# Patient Record
Sex: Female | Born: 1980 | Race: White | Hispanic: No | Marital: Married | Smoking: Never smoker
Health system: Southern US, Community
[De-identification: ages and names within clinical notes are randomized; demographics above are authoritative.]

## PROBLEM LIST (undated history)

## (undated) DIAGNOSIS — K219 Gastro-esophageal reflux disease without esophagitis: Secondary | ICD-10-CM

## (undated) HISTORY — PX: TONSILLECTOMY: SUR1361

## (undated) HISTORY — PX: OTHER SURGICAL HISTORY: SHX169

## (undated) HISTORY — PX: WISDOM TOOTH EXTRACTION: SHX21

---

## 1998-09-27 ENCOUNTER — Emergency Department (HOSPITAL_COMMUNITY): Admission: EM | Admit: 1998-09-27 | Discharge: 1998-09-27 | Payer: Self-pay | Admitting: Emergency Medicine

## 2003-07-01 ENCOUNTER — Other Ambulatory Visit: Admission: RE | Admit: 2003-07-01 | Discharge: 2003-07-01 | Payer: Self-pay | Admitting: Obstetrics and Gynecology

## 2004-07-01 ENCOUNTER — Other Ambulatory Visit: Admission: RE | Admit: 2004-07-01 | Discharge: 2004-07-01 | Payer: Self-pay | Admitting: Obstetrics and Gynecology

## 2005-06-16 ENCOUNTER — Encounter: Payer: Self-pay | Admitting: Internal Medicine

## 2005-06-16 LAB — CONVERTED CEMR LAB

## 2005-07-13 ENCOUNTER — Other Ambulatory Visit: Admission: RE | Admit: 2005-07-13 | Discharge: 2005-07-13 | Payer: Self-pay | Admitting: Obstetrics and Gynecology

## 2005-12-18 ENCOUNTER — Ambulatory Visit: Payer: Self-pay | Admitting: Internal Medicine

## 2006-01-23 ENCOUNTER — Ambulatory Visit: Payer: Self-pay | Admitting: Internal Medicine

## 2006-01-26 ENCOUNTER — Ambulatory Visit: Payer: Self-pay | Admitting: Internal Medicine

## 2006-07-13 ENCOUNTER — Other Ambulatory Visit: Admission: RE | Admit: 2006-07-13 | Discharge: 2006-07-13 | Payer: Self-pay | Admitting: Obstetrics & Gynecology

## 2007-07-02 ENCOUNTER — Ambulatory Visit: Payer: Self-pay | Admitting: Internal Medicine

## 2007-07-07 ENCOUNTER — Encounter: Payer: Self-pay | Admitting: Internal Medicine

## 2007-07-07 DIAGNOSIS — J45909 Unspecified asthma, uncomplicated: Secondary | ICD-10-CM | POA: Insufficient documentation

## 2007-07-07 DIAGNOSIS — M4120 Other idiopathic scoliosis, site unspecified: Secondary | ICD-10-CM | POA: Insufficient documentation

## 2007-07-07 DIAGNOSIS — E785 Hyperlipidemia, unspecified: Secondary | ICD-10-CM

## 2007-07-07 DIAGNOSIS — J309 Allergic rhinitis, unspecified: Secondary | ICD-10-CM | POA: Insufficient documentation

## 2007-07-09 ENCOUNTER — Ambulatory Visit: Payer: Self-pay | Admitting: Internal Medicine

## 2007-07-15 ENCOUNTER — Other Ambulatory Visit: Admission: RE | Admit: 2007-07-15 | Discharge: 2007-07-15 | Payer: Self-pay | Admitting: Obstetrics and Gynecology

## 2010-01-12 ENCOUNTER — Inpatient Hospital Stay (HOSPITAL_COMMUNITY): Admission: AD | Admit: 2010-01-12 | Discharge: 2010-01-15 | Payer: Self-pay | Admitting: Obstetrics and Gynecology

## 2010-01-17 ENCOUNTER — Encounter: Admission: RE | Admit: 2010-01-17 | Discharge: 2010-02-16 | Payer: Self-pay | Admitting: Obstetrics and Gynecology

## 2010-01-19 ENCOUNTER — Ambulatory Visit: Admission: RE | Admit: 2010-01-19 | Discharge: 2010-01-19 | Payer: Self-pay | Admitting: Obstetrics and Gynecology

## 2010-02-17 ENCOUNTER — Encounter: Admission: RE | Admit: 2010-02-17 | Discharge: 2010-03-17 | Payer: Self-pay | Admitting: Obstetrics and Gynecology

## 2011-01-04 LAB — CBC
HCT: 33.6 % — ABNORMAL LOW (ref 36.0–46.0)
Platelets: 109 10*3/uL — ABNORMAL LOW (ref 150–400)
RDW: 15.9 % — ABNORMAL HIGH (ref 11.5–15.5)
WBC: 18.4 10*3/uL — ABNORMAL HIGH (ref 4.0–10.5)

## 2011-01-04 LAB — CCBB MATERNAL DONOR DRAW

## 2011-01-09 LAB — CBC
Hemoglobin: 13.2 g/dL (ref 12.0–15.0)
RDW: 15.7 % — ABNORMAL HIGH (ref 11.5–15.5)
WBC: 11.3 10*3/uL — ABNORMAL HIGH (ref 4.0–10.5)

## 2011-01-09 LAB — COMPREHENSIVE METABOLIC PANEL
ALT: UNDETERMINED U/L (ref 0–35)
AST: UNDETERMINED U/L (ref 0–37)
Alkaline Phosphatase: 223 U/L — ABNORMAL HIGH (ref 39–117)
Calcium: 9 mg/dL (ref 8.4–10.5)
GFR calc Af Amer: >60 mL/min (ref >60)
Potassium: 4.8 mEq/L (ref 3.5–5.1)
Sodium: 137 mEq/L (ref 135–145)
Total Protein: 5.4 g/dL — ABNORMAL LOW (ref 6.0–8.3)

## 2011-01-09 LAB — LACTATE DEHYDROGENASE: LDH: 144 U/L (ref 94–250)

## 2011-01-09 LAB — AST: AST: 21 U/L (ref 0–37)

## 2011-01-11 LAB — OB RESULTS CONSOLE ABO/RH

## 2011-01-11 LAB — OB RESULTS CONSOLE ANTIBODY SCREEN: Antibody Screen: NEGATIVE

## 2011-01-11 LAB — OB RESULTS CONSOLE GBS: GBS: NEGATIVE

## 2011-01-11 LAB — OB RESULTS CONSOLE GC/CHLAMYDIA: Gonorrhea: NEGATIVE

## 2011-01-11 LAB — OB RESULTS CONSOLE RUBELLA ANTIBODY, IGM: Rubella: IMMUNE

## 2011-10-17 NOTE — L&D Delivery Note (Signed)
Patient was C/C+1 and pushed for approx 90 minutes with epidural.   NSVD female infant, Apgars 9/9, weight pending.   The patient had a second degree perineal lac repaired with 2-0 vicryl. Fundus was firm. EBL was expected. Placenta was delivered intact, handed off for cord blood collection. Vagina was clear.  Baby was vigorous to bedside.  Erin Barajas

## 2012-01-11 LAB — OB RESULTS CONSOLE GC/CHLAMYDIA
Chlamydia: NEGATIVE
Gonorrhea: NEGATIVE

## 2012-01-11 LAB — OB RESULTS CONSOLE HIV ANTIBODY (ROUTINE TESTING): HIV: NONREACTIVE

## 2012-07-30 ENCOUNTER — Encounter (HOSPITAL_COMMUNITY): Payer: Self-pay | Admitting: Anesthesiology

## 2012-07-30 ENCOUNTER — Encounter (HOSPITAL_COMMUNITY): Payer: Self-pay | Admitting: *Deleted

## 2012-07-30 ENCOUNTER — Inpatient Hospital Stay (HOSPITAL_COMMUNITY): Payer: Managed Care, Other (non HMO) | Admitting: Anesthesiology

## 2012-07-30 ENCOUNTER — Inpatient Hospital Stay (HOSPITAL_COMMUNITY)
Admission: AD | Admit: 2012-07-30 | Discharge: 2012-08-01 | DRG: 775 | Disposition: A | Discharge status: Home / Self Care | Payer: Managed Care, Other (non HMO) | Source: Ambulatory Visit | Attending: Obstetrics & Gynecology | Admitting: Obstetrics & Gynecology

## 2012-07-30 DIAGNOSIS — O4290 Premature rupture of membranes, unspecified as to length of time between rupture and onset of labor, unspecified weeks of gestation: Secondary | ICD-10-CM | POA: Diagnosis present

## 2012-07-30 DIAGNOSIS — Z3480 Encounter for supervision of other normal pregnancy, unspecified trimester: Secondary | ICD-10-CM

## 2012-07-30 HISTORY — DX: Gastro-esophageal reflux disease without esophagitis: K21.9

## 2012-07-30 LAB — CBC
Platelets: 160 10*3/uL (ref 150–400)
RBC: 4.24 MIL/uL (ref 3.87–5.11)
RDW: 15 % (ref 11.5–15.5)
WBC: 7.6 10*3/uL (ref 4.0–10.5)

## 2012-07-30 LAB — ABO/RH: ABO/RH(D): O POS

## 2012-07-30 LAB — POCT FERN TEST: Fern Test: POSITIVE

## 2012-07-30 LAB — RPR: RPR Ser Ql: NONREACTIVE

## 2012-07-30 MED ORDER — ONDANSETRON HCL 4 MG/2ML IJ SOLN: 4.0000 mg | Freq: Four times a day (QID) | INTRAMUSCULAR | Status: DC | PRN

## 2012-07-30 MED ORDER — SIMETHICONE 80 MG PO CHEW: 80.0000 mg | CHEWABLE_TABLET | ORAL | Status: DC | PRN

## 2012-07-30 MED ORDER — IBUPROFEN 600 MG PO TABS: 600.0000 mg | ORAL_TABLET | Freq: Four times a day (QID) | ORAL | Status: DC | PRN

## 2012-07-30 MED ORDER — BENZOCAINE-MENTHOL 20-0.5 % EX AERO
1.0000 "application " | INHALATION_SPRAY | CUTANEOUS | Status: DC | PRN
  Administered 2012-07-30 – 2012-08-01 (×2): 1 via TOPICAL
  Filled 2012-07-30 (×2): qty 56

## 2012-07-30 MED ORDER — ONDANSETRON HCL 4 MG/2ML IJ SOLN: 4.0000 mg | INTRAMUSCULAR | Status: DC | PRN

## 2012-07-30 MED ORDER — OXYCODONE-ACETAMINOPHEN 5-325 MG PO TABS: 1.0000 | ORAL_TABLET | ORAL | Status: DC | PRN

## 2012-07-30 MED ORDER — LACTATED RINGERS IV SOLN
500.0000 mL | INTRAVENOUS | Status: DC | PRN
  Administered 2012-07-30: 1000 mL via INTRAVENOUS

## 2012-07-30 MED ORDER — CITRIC ACID-SODIUM CITRATE 334-500 MG/5ML PO SOLN: 30.0000 mL | ORAL | Status: DC | PRN

## 2012-07-30 MED ORDER — OXYTOCIN BOLUS FROM INFUSION
500.0000 mL | Freq: Once | INTRAVENOUS | Status: AC
  Administered 2012-07-30: 500 mL via INTRAVENOUS
  Filled 2012-07-30: qty 500

## 2012-07-30 MED ORDER — IBUPROFEN 600 MG PO TABS
600.0000 mg | ORAL_TABLET | Freq: Four times a day (QID) | ORAL | Status: DC
  Administered 2012-07-30 – 2012-08-01 (×7): 600 mg via ORAL
  Filled 2012-07-30 (×6): qty 1

## 2012-07-30 MED ORDER — EPHEDRINE 5 MG/ML INJ
10.0000 mg | INTRAVENOUS | Status: DC | PRN
  Filled 2012-07-30: qty 4

## 2012-07-30 MED ORDER — ONDANSETRON HCL 4 MG PO TABS: 4.0000 mg | ORAL_TABLET | ORAL | Status: DC | PRN

## 2012-07-30 MED ORDER — DIPHENHYDRAMINE HCL 25 MG PO CAPS: 25.0000 mg | ORAL_CAPSULE | Freq: Four times a day (QID) | ORAL | Status: DC | PRN

## 2012-07-30 MED ORDER — ACETAMINOPHEN 325 MG PO TABS: 650.0000 mg | ORAL_TABLET | ORAL | Status: DC | PRN

## 2012-07-30 MED ORDER — EPHEDRINE 5 MG/ML INJ: 10.0000 mg | INTRAVENOUS | Status: DC | PRN

## 2012-07-30 MED ORDER — FAMOTIDINE 20 MG PO TABS
20.0000 mg | ORAL_TABLET | Freq: Two times a day (BID) | ORAL | Status: DC | PRN
  Administered 2012-07-30: 20 mg via ORAL
  Filled 2012-07-30: qty 1

## 2012-07-30 MED ORDER — LACTATED RINGERS IV SOLN
INTRAVENOUS | Status: DC
  Administered 2012-07-30 (×2): via INTRAVENOUS

## 2012-07-30 MED ORDER — PRENATAL MULTIVITAMIN CH: 1.0000 | ORAL_TABLET | Freq: Every day | ORAL | Status: DC

## 2012-07-30 MED ORDER — WITCH HAZEL-GLYCERIN EX PADS: 1.0000 "application " | MEDICATED_PAD | CUTANEOUS | Status: DC | PRN

## 2012-07-30 MED ORDER — PHENYLEPHRINE 40 MCG/ML (10ML) SYRINGE FOR IV PUSH (FOR BLOOD PRESSURE SUPPORT)
80.0000 ug | PREFILLED_SYRINGE | INTRAVENOUS | Status: DC | PRN
  Filled 2012-07-30: qty 5

## 2012-07-30 MED ORDER — SENNOSIDES-DOCUSATE SODIUM 8.6-50 MG PO TABS
2.0000 | ORAL_TABLET | Freq: Every day | ORAL | Status: DC
  Administered 2012-07-30 – 2012-07-31 (×2): 2 via ORAL

## 2012-07-30 MED ORDER — INFLUENZA VIRUS VACC SPLIT PF IM SUSP
0.5000 mL | INTRAMUSCULAR | Status: AC
  Administered 2012-07-31: 0.5 mL via INTRAMUSCULAR
  Filled 2012-07-30: qty 0.5

## 2012-07-30 MED ORDER — LANOLIN HYDROUS EX OINT: TOPICAL_OINTMENT | CUTANEOUS | Status: DC | PRN

## 2012-07-30 MED ORDER — FENTANYL 2.5 MCG/ML BUPIVACAINE 1/10 % EPIDURAL INFUSION (WH - ANES)
14.0000 mL/h | INTRAMUSCULAR | Status: DC
  Administered 2012-07-30 (×2): 14 mL/h via EPIDURAL
  Filled 2012-07-30 (×2): qty 125

## 2012-07-30 MED ORDER — ZOLPIDEM TARTRATE 5 MG PO TABS: 5.0000 mg | ORAL_TABLET | Freq: Every evening | ORAL | Status: DC | PRN

## 2012-07-30 MED ORDER — LIDOCAINE HCL (PF) 1 % IJ SOLN
30.0000 mL | INTRAMUSCULAR | Status: DC | PRN
  Filled 2012-07-30: qty 30

## 2012-07-30 MED ORDER — PRENATAL MULTIVITAMIN CH
1.0000 | ORAL_TABLET | Freq: Every day | ORAL | Status: DC
  Administered 2012-07-30 – 2012-08-01 (×3): 1 via ORAL
  Filled 2012-07-30 (×3): qty 1

## 2012-07-30 MED ORDER — LACTATED RINGERS IV SOLN: 500.0000 mL | Freq: Once | INTRAVENOUS | Status: DC

## 2012-07-30 MED ORDER — BUTORPHANOL TARTRATE 1 MG/ML IJ SOLN: 1.0000 mg | INTRAMUSCULAR | Status: DC | PRN

## 2012-07-30 MED ORDER — DIPHENHYDRAMINE HCL 50 MG/ML IJ SOLN: 12.5000 mg | INTRAMUSCULAR | Status: DC | PRN

## 2012-07-30 MED ORDER — DIBUCAINE 1 % RE OINT: 1.0000 "application " | TOPICAL_OINTMENT | RECTAL | Status: DC | PRN

## 2012-07-30 MED ORDER — TETANUS-DIPHTH-ACELL PERTUSSIS 5-2.5-18.5 LF-MCG/0.5 IM SUSP: 0.5000 mL | Freq: Once | INTRAMUSCULAR | Status: DC

## 2012-07-30 MED ORDER — PHENYLEPHRINE 40 MCG/ML (10ML) SYRINGE FOR IV PUSH (FOR BLOOD PRESSURE SUPPORT): 80.0000 ug | PREFILLED_SYRINGE | INTRAVENOUS | Status: DC | PRN

## 2012-07-30 MED ORDER — LIDOCAINE HCL (PF) 1 % IJ SOLN
INTRAMUSCULAR | Status: DC | PRN
  Administered 2012-07-30 (×4): 4 mL

## 2012-07-30 MED ORDER — OXYTOCIN 40 UNITS IN LACTATED RINGERS INFUSION - SIMPLE MED
62.5000 mL/h | Freq: Once | INTRAVENOUS | Status: DC
  Filled 2012-07-30: qty 1000

## 2012-07-30 NOTE — H&P (Signed)
  31 y.o. G2P1001  Estimated Date of Delivery: 07/31/12 admitted at 39/[redacted] weeks gestation PROM.  Prenatal Transfer Tool  Maternal Diabetes: No Genetic Screening: Normal Maternal Ultrasounds/Referrals: Normal Fetal Ultrasounds or other Referrals:  None Maternal Substance Abuse:  No Significant Maternal Medications:  None Significant Maternal Lab Results: None Other Significant Pregnancy Complications: None   Afebrile, VSS Heart and Lungs: No active disease Abdomen: soft, gravid, EFW AGA. Cervical exam:  3/50, grossly ruptured.  Impression: PROM  Plan:  Observe for labor, induce if necessary

## 2012-07-30 NOTE — Anesthesia Procedure Notes (Signed)
Epidural Patient location during procedure: OB Start time: 07/30/2012 4:46 AM  Staffing Performed by: anesthesiologist   Preanesthetic Checklist Completed: patient identified, site marked, surgical consent, pre-op evaluation, timeout performed, IV checked, risks and benefits discussed and monitors and equipment checked  Epidural Patient position: sitting Prep: site prepped and draped and DuraPrep Patient monitoring: continuous pulse ox and blood pressure Approach: midline Injection technique: LOR air  Needle:  Needle type: Tuohy  Needle gauge: 17 G Needle length: 9 cm and 9 Needle insertion depth: 7 cm Catheter type: closed end flexible Catheter size: 19 Gauge Catheter at skin depth: 12 cm Test dose: negative  Assessment Events: blood not aspirated, injection not painful, no injection resistance, negative IV test and no paresthesia  Additional Notes Discussed risk of headache, infection, bleeding, nerve injury and failed or incomplete block.  Patient voices understanding and wishes to proceed. Reason for block:procedure for pain

## 2012-07-30 NOTE — Anesthesia Postprocedure Evaluation (Signed)
  Anesthesia Post-op Note  Patient: Erin Barajas  Procedure(s) Performed: * No procedures listed *  Patient Location: PACU and Mother/Baby  Anesthesia Type: Epidural  Level of Consciousness: awake, alert  and oriented  Airway and Oxygen Therapy: Patient Spontanous Breathing  Post-op Pain: mild  Post-op Assessment: Patient's Cardiovascular Status Stable, Respiratory Function Stable, No signs of Nausea or vomiting and Pain level controlled  Post-op Vital Signs: stable  Complications: No apparent anesthesia complications

## 2012-07-30 NOTE — MAU Note (Signed)
Pt reports ROM @ 0040, greenish fluid. Contractions started after ROM

## 2012-07-30 NOTE — Anesthesia Preprocedure Evaluation (Signed)
Anesthesia Evaluation  Patient identified by MRN, date of birth, ID band Patient awake    Reviewed: Allergy & Precautions, H&P , NPO status , Patient's Chart, lab work & pertinent test results, reviewed documented beta blocker date and time   History of Anesthesia Complications Negative for: history of anesthetic complications  Airway Mallampati: II TM Distance: >3 FB Neck ROM: full    Dental  (+) Teeth Intact   Pulmonary neg pulmonary ROS,  breath sounds clear to auscultation        Cardiovascular negative cardio ROS  Rhythm:regular Rate:Normal     Neuro/Psych negative neurological ROS  negative psych ROS   GI/Hepatic Neg liver ROS, GERD-  Medicated,  Endo/Other  negative endocrine ROS  Renal/GU negative Renal ROS     Musculoskeletal   Abdominal   Peds  Hematology negative hematology ROS (+)   Anesthesia Other Findings   Reproductive/Obstetrics (+) Pregnancy                           Anesthesia Physical Anesthesia Plan  ASA: II  Anesthesia Plan: Epidural   Post-op Pain Management:    Induction:   Airway Management Planned:   Additional Equipment:   Intra-op Plan:   Post-operative Plan:   Informed Consent: I have reviewed the patients History and Physical, chart, labs and discussed the procedure including the risks, benefits and alternatives for the proposed anesthesia with the patient or authorized representative who has indicated his/her understanding and acceptance.     Plan Discussed with:   Anesthesia Plan Comments:         Anesthesia Quick Evaluation  

## 2012-07-30 NOTE — Progress Notes (Signed)
Pt comfortable, s/p epidural. FHT reassuring 140 +accels TOCO q2-4 SVE: per RN 6.6/70/-2 Continue current mgmt

## 2012-07-31 LAB — CBC
HCT: 36 % (ref 36.0–46.0)
Hemoglobin: 11.6 g/dL — ABNORMAL LOW (ref 12.0–15.0)
MCHC: 32.2 g/dL (ref 30.0–36.0)
MCV: 87.4 fL (ref 78.0–100.0)
RDW: 15.1 % (ref 11.5–15.5)

## 2012-07-31 MED ORDER — SUCROSE 24% NICU/PEDS ORAL SOLUTION
0.5000 mL | OROMUCOSAL | Status: AC
  Filled 2012-07-31 (×2): qty 0.5

## 2012-07-31 MED ORDER — LIDOCAINE 1%/NA BICARB 0.1 MEQ INJECTION
0.8000 mL | INJECTION | Freq: Once | INTRAVENOUS | Status: DC
  Filled 2012-07-31: qty 1

## 2012-07-31 MED ORDER — ACETAMINOPHEN FOR CIRCUMCISION 160 MG/5 ML
40.0000 mg | Freq: Once | ORAL | Status: DC
  Filled 2012-07-31: qty 2.5

## 2012-07-31 MED ORDER — EPINEPHRINE TOPICAL FOR CIRCUMCISION 0.1 MG/ML
1.0000 [drp] | TOPICAL | Status: DC | PRN
  Filled 2012-07-31: qty 0.05

## 2012-07-31 MED ORDER — ACETAMINOPHEN FOR CIRCUMCISION 160 MG/5 ML
40.0000 mg | ORAL | Status: DC | PRN
  Filled 2012-07-31: qty 2.5

## 2012-07-31 NOTE — Progress Notes (Signed)
Post Partum Day 1 Subjective: no complaints  Objective: Blood pressure 119/74, pulse 82, temperature 97.4 F (36.3 C), temperature source Oral, resp. rate 18, height 5\' 8"  (1.727 m), weight 197 lb (89.359 kg), breastfeeding.  Physical Exam:  General: alert Lochia: appropriate Uterine Fundus: firm   Basename 07/31/12 0530 07/30/12 0240  HGB 11.6* 12.0  HCT 36.0 36.8    Assessment/Plan: Plan for discharge tomorrow   LOS: 1 day   Tarrah Furuta D 07/31/2012, 8:56 AM

## 2012-08-01 ENCOUNTER — Encounter (HOSPITAL_COMMUNITY)
Admission: RE | Admit: 2012-08-01 | Discharge: 2012-08-01 | Disposition: A | Discharge status: Home / Self Care | Payer: Managed Care, Other (non HMO) | Source: Ambulatory Visit | Attending: Obstetrics and Gynecology | Admitting: Obstetrics and Gynecology

## 2012-08-01 DIAGNOSIS — O923 Agalactia: Secondary | ICD-10-CM | POA: Insufficient documentation

## 2012-08-01 MED ORDER — OXYCODONE-ACETAMINOPHEN 5-325 MG PO TABS: 1.0000 | ORAL_TABLET | ORAL | Status: AC | PRN

## 2012-08-01 NOTE — Discharge Summary (Signed)
Obstetric Discharge Summary Reason for Admission: rupture of membranes Prenatal Procedures: ultrasound Intrapartum Procedures: spontaneous vaginal delivery Postpartum Procedures: none Complications-Operative and Postpartum: 2 degree perineal laceration Hemoglobin  Date Value Range Status  07/31/2012 11.6* 12.0 - 15.0 g/dL Final     HCT  Date Value Range Status  07/31/2012 36.0  36.0 - 46.0 % Final    Physical Exam:  General: alert Lochia: appropriate Uterine Fundus: firm   Discharge Diagnoses: Term Pregnancy-delivered  Discharge Information: Date: 08/01/2012 Activity: pelvic rest Diet: routine Medications: PNV, Ibuprofen and Percocet Condition: stable Instructions: refer to practice specific booklet Discharge to: home   Newborn Data: Live born female  Birth Weight: 8 lb 11.2 oz (3945 g) APGAR: 8, 9  Home with mother.  Barby Colvard E 08/01/2012, 8:11 AM

## 2012-08-01 NOTE — Progress Notes (Signed)
Pt discharged before Sw could assess history of PP depression.

## 2012-08-01 NOTE — Progress Notes (Signed)
PPD#2 Pt without complaints. VSSAF Lochia-wnl IMP/ stable Plan/ discharge.

## 2012-09-01 ENCOUNTER — Encounter (HOSPITAL_COMMUNITY)
Admission: RE | Admit: 2012-09-01 | Discharge: 2012-09-01 | Disposition: A | Discharge status: Home / Self Care | Payer: Managed Care, Other (non HMO) | Source: Ambulatory Visit | Attending: Obstetrics and Gynecology | Admitting: Obstetrics and Gynecology

## 2012-09-01 DIAGNOSIS — O923 Agalactia: Secondary | ICD-10-CM | POA: Insufficient documentation

## 2014-08-17 ENCOUNTER — Encounter (HOSPITAL_COMMUNITY): Payer: Self-pay | Admitting: *Deleted

## 2018-08-21 ENCOUNTER — Other Ambulatory Visit: Payer: Self-pay | Admitting: Obstetrics and Gynecology

## 2018-08-21 DIAGNOSIS — Z9189 Other specified personal risk factors, not elsewhere classified: Secondary | ICD-10-CM

## 2018-09-03 ENCOUNTER — Ambulatory Visit
Admission: RE | Admit: 2018-09-03 | Discharge: 2018-09-03 | Disposition: A | Discharge status: Home / Self Care | Payer: BLUE CROSS/BLUE SHIELD | Source: Ambulatory Visit | Attending: Obstetrics and Gynecology | Admitting: Obstetrics and Gynecology

## 2018-09-03 DIAGNOSIS — Z9189 Other specified personal risk factors, not elsewhere classified: Secondary | ICD-10-CM

## 2018-09-03 MED ORDER — GADOBUTROL 1 MMOL/ML IV SOLN
7.0000 mL | Freq: Once | INTRAVENOUS | Status: AC | PRN
  Administered 2018-09-03: 7 mL via INTRAVENOUS

## 2018-09-09 ENCOUNTER — Other Ambulatory Visit: Payer: Self-pay | Admitting: Obstetrics and Gynecology

## 2018-09-09 DIAGNOSIS — N63 Unspecified lump in unspecified breast: Secondary | ICD-10-CM

## 2018-09-20 ENCOUNTER — Ambulatory Visit
Admission: RE | Admit: 2018-09-20 | Discharge: 2018-09-20 | Disposition: A | Discharge status: Home / Self Care | Payer: BLUE CROSS/BLUE SHIELD | Source: Ambulatory Visit | Attending: Obstetrics and Gynecology | Admitting: Obstetrics and Gynecology

## 2018-09-20 DIAGNOSIS — N63 Unspecified lump in unspecified breast: Secondary | ICD-10-CM

## 2018-09-20 MED ORDER — GADOBUTROL 1 MMOL/ML IV SOLN
7.0000 mL | Freq: Once | INTRAVENOUS | Status: AC | PRN
  Administered 2018-09-20: 7 mL via INTRAVENOUS

## 2019-12-16 IMAGING — MG MM CLIP PLACEMENT
2 series · 2 of 2 positions shown · non-contrast
Comparison: Previous exam(s).

CLINICAL DATA: Status post MR guided core biopsy of non mass
enhancement in the UPPER INNER QUADRANT of the RIGHT breast.

EXAM:
DIAGNOSTIC RIGHT MAMMOGRAM POST MRI BIOPSY

[R CC]
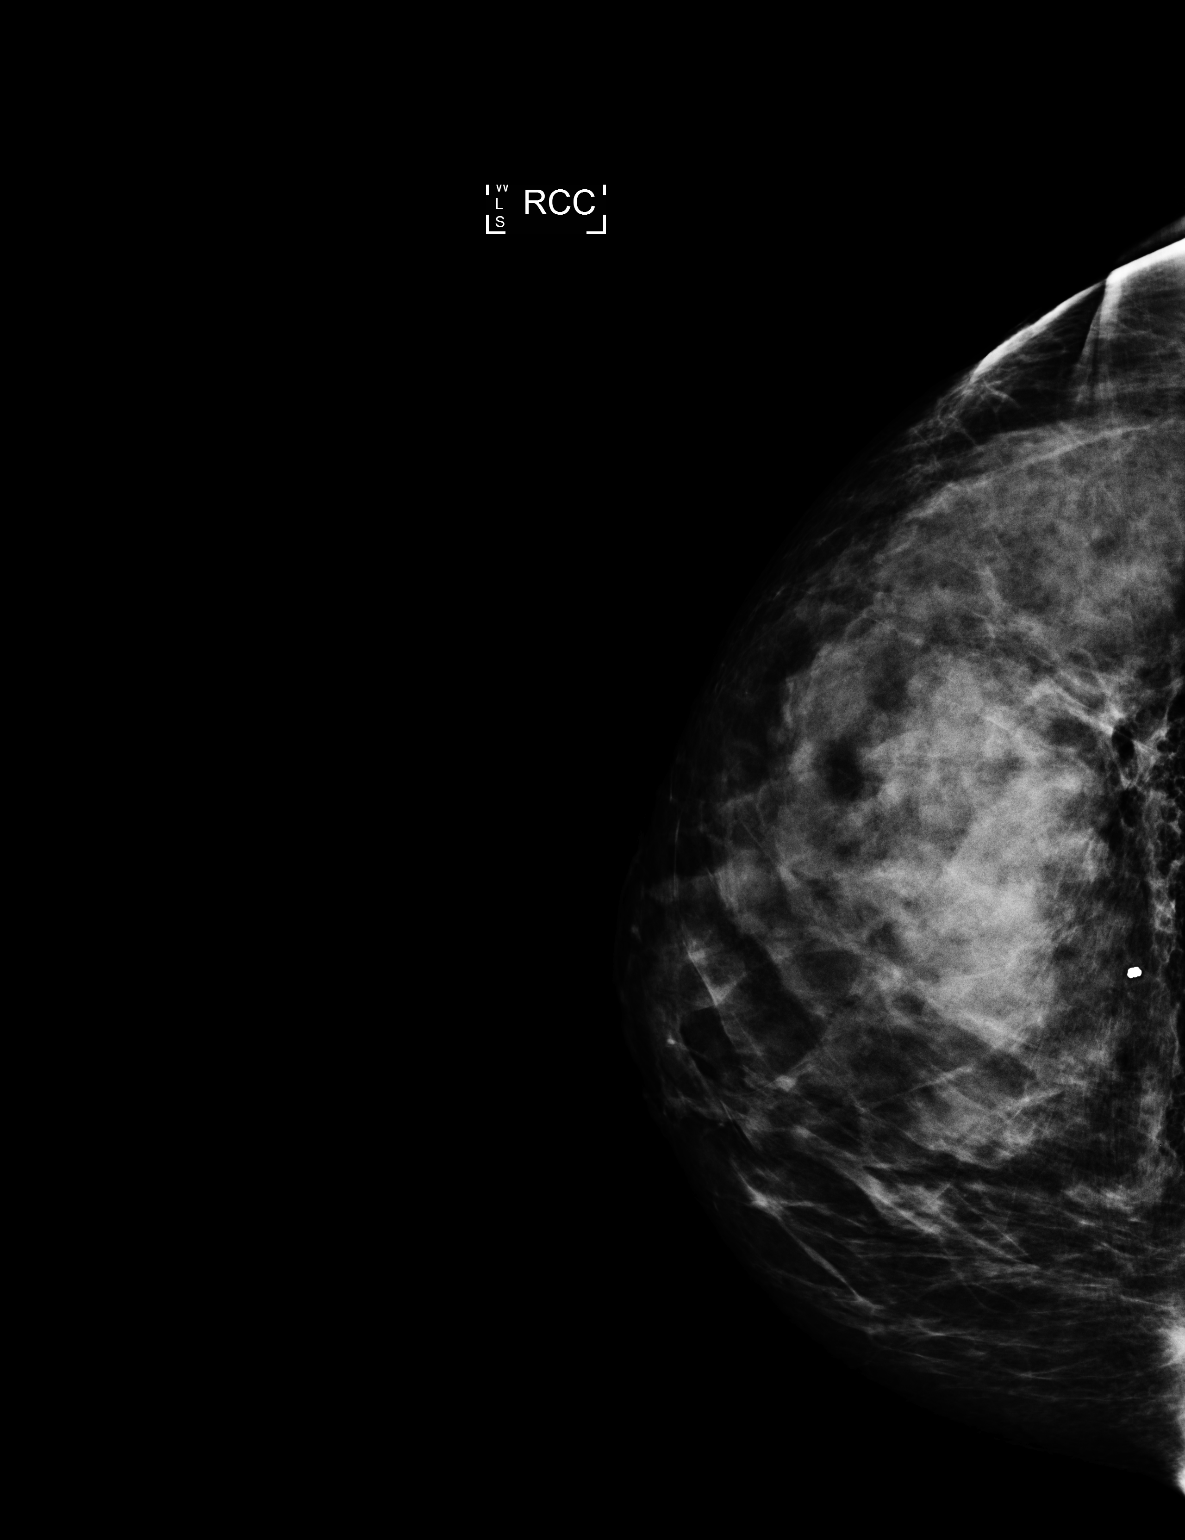

[R ML]
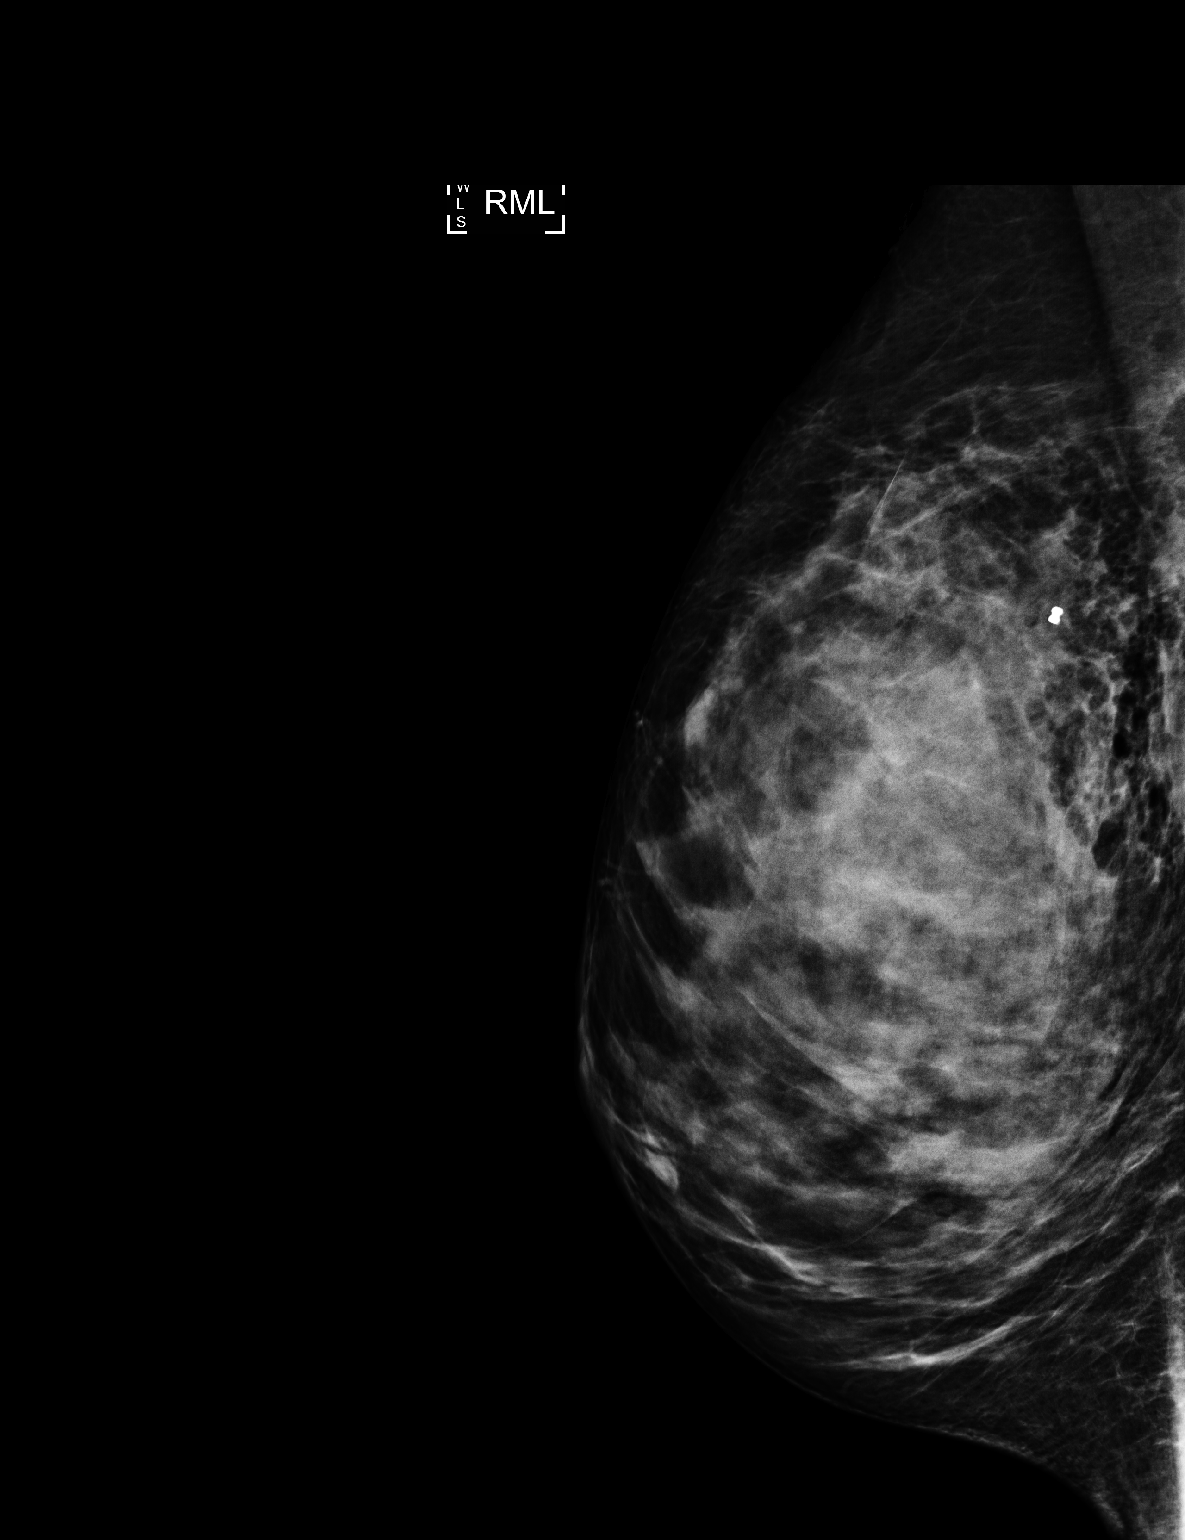

[2 of 2 positions shown; findings below may reference images not displayed]

FINDINGS: Mammographic images were obtained following MRI guided biopsy of non
mass enhancement in the UPPER INNER QUADRANT of the RIGHT breast and
placement of a dumbbell-shaped clip. The clip is in expected
location in the UPPER INNER QUADRANT following biopsy.
IMPRESSION: Tissue marker clip in the expected location following biopsy.

Final Assessment: Post Procedure Mammograms for Marker Placement

## 2020-01-12 NOTE — ED Notes (Signed)
ED Triage Note       ED Triage Adult Entered On:  01/12/2020 21:30 EDT    Performed On:  01/12/2020 21:25 EDT by Lorin Picket, RN, Rudean Haskell               Triage   Chief Complaint :   Missed a step and foot went out under her hyperflexing her foot.  She felt a pop.   Numeric Rating Pain Scale :   4   [place] Mode of Arrival :   Private vehicle   Infectious Disease Documentation :   Document assessment   Temperature Oral :   37 degC(Converted to: 98.6 degF)    Heart Rate Monitored :   87 bpm   Respiratory Rate :   17 br/min   Systolic Blood Pressure :   132 mmHg   Diastolic Blood Pressure :   77 mmHg   SpO2 :   99 %   Oxygen Therapy :   Room air   Patient presentation :   None of the above   Chief Complaint or Presentation suggest infection :   No   Dosing Weight Obtained By :   Patient stated   Weight Dosing :   70 kg(Converted to: 154 lb 5 oz)    Height :   160 cm(Converted to: 5 ft 3 in)    Body Mass Index Dosing :   27 kg/m2   Juline Patch - 01/12/2020 21:25 EDT   DCP GENERIC CODE   Tracking Acuity :   3   Tracking Group :   ED [facility] Tracking [facility]   Burien, Loanne Drilling 01/12/2020 21:25 EDT   ED General Section :   Document assessment   Pregnancy Status :   Patient denies   ED Allergies Section :   Document assessment   ED Reason for Visit Section :   Document assessment   Juline Patch - 01/12/2020 21:25 EDT   ID Risk Screen Symptoms   Recent Travel History :   No recent travel   Close Contact with COVID-19 ID :   No   Last 14 days COVID-19 ID :   No   TB Symptom Screen :   No symptoms   Juline Patch - 01/12/2020 21:25 EDT   Allergies   (As Of: 01/12/2020 21:30:26 EDT)   Allergies (Active)   penicillin  Estimated Onset Date:   Unspecified ; Reactions:   Rash ; Created By:   Lorin Picket, RN, Rudean Haskell; Reaction Status:   Active ; Category:   Drug ; Substance:   penicillin ; Type:   Allergy ; Severity:   Moderate ; Updated By:   Lorin Picket, RN, Rudean Haskell; Reviewed Date:   01/12/2020 21:29 EDT         Psycho-Social   Last 3 mo, thoughts killing self/others :   Patient denies   Feels Unsafe at Home :   No   Lorin Picket, RNRudean Haskell 01/12/2020 21:25 EDT   ED Reason for Visit   (As Of: 01/12/2020 21:30:26 EDT)   Diagnoses(Active)    Foot pain-swelling  Date:   01/12/2020 ; Diagnosis Type:   Reason For Visit ; Confirmation:   Complaint of ; Clinical Dx:   Foot pain-swelling ; Classification:   Medical ; Clinical Service:   Emergency medicine ; Code:   PNED ; Probability:   0 ; Diagnosis Code:   [id number] BEB

## 2020-01-12 NOTE — ED Provider Notes (Signed)
Ankle-Foot-Toe Pain *ED        Patient:   Michelle Barrera, Michelle Barrera             MRN: [id number]            FIN: [id number]               Age:   39 years     Sex:  Female     DOB:  27-Jun-1981   Associated Diagnoses:   Sprain of left foot   Author:   Gordy Councilman S-MD      Basic Information   Time seen: Provider Seen (ST)   ED Provider/Time:    Gordy Councilman S-MD / 01/12/2020 21:21    .   Additional information: Chief Complaint from Nursing Triage Note   Chief Complaint  Chief Complaint: Missed a step and foot went out under her hyperflexing her foot.  She felt a pop. (01/12/20 21:25:00)  .     39 year old female presents for concerns of left foot pain and tenderness along the anterior surface.  It occurred after hyperflexing her foot few hours prior to arrival.  Patient states that anytime she walks she would feel something pop.  No focal weakness, numbness, tingling.  Soft tissue swelling has increased.  She has been placing some ice with intermittent improvement.  No other symptoms endorsed.      Review of Systems   Constitutional symptoms:  Negative except as documented in HPI.   Musculoskeletal symptoms:  Muscle pain, Joint pain.    Neurologic symptoms:  Negative except as documented in HPI.             Additional review of systems information: All other systems reviewed and otherwise negative.      Health Status   Allergies:    Allergic Reactions (All)  Moderate  Penicillin- Rash.  .      Past Medical/ Family/ Social History   Medical history: Reviewed as documented in chart.   Surgical history: Reviewed as documented in chart.   Family history: Not significant.   Social history: Reviewed as documented in chart.   Problem list:    No qualifying data available    .      Physical Examination               Vital Signs             Time:  01/12/2020 22:04:00.   Vital Signs   01/12/2020 21:25 EDT Systolic Blood Pressure 132 mmHg    Diastolic Blood Pressure 77 mmHg    Temperature Oral 37 degC    Heart Rate Monitored 87 bpm     Respiratory Rate 17 br/min    SpO2 99 %   .   Oxygen saturation.   General:  Alert, no acute distress.    Skin:  Warm, dry, pink.    Head:  Normocephalic.   Neck:  Supple, trachea midline.    Eye:  Pupils are equal, round and reactive to light, extraocular movements are intact.    Cardiovascular:  Regular rate and rhythm, Normal peripheral perfusion.    Respiratory:  Lungs are clear to auscultation, respirations are non-labored.    Musculoskeletal:  Left foot exam remarkable for soft tissue swelling anteriorly, diffuse and nonfocal tenderness to palpation.  Neurovascularly intact..   Chest wall   Neurological:  Alert and oriented to person, place, time, and situation, No focal neurological deficit observed.    Lymphatics   Psychiatric:  Cooperative, appropriate mood & affect.       Medical Decision Making   Rationale:  39 year old female presents for concerns of left foot pain and tenderness after a straining mechanism.  I see no acute fracture on my review.  Given soft tissue swelling, we will put her in a postop shoe and have her follow-up with orthopedics.  After the patient had left the emergency department, the radiologist read this as a single view only potential fracture along the either the second third and fourth, she is already in a postop shoe and will follow up with orthopedics as previously planned. 39 year old female presents for concerns of left foot pain and tenderness after a straining mechanism.  I see no acute fracture on my review.  Given soft tissue swelling, we will put her in a postop shoe and have her follow-up with orthopedics. .   Documents reviewed:  Emergency department nurses' notes, flowsheet, emergency department records, prior records, vital signs.    Radiology results:  Rad Results (ST)   XR Foot Complete Left  ?  01/12/20 22:19:43  Left foot AP, lateral, and oblique: 01/12/20    INDICATION:Pain, Joint, Ankle/Foot.    COMPARISON: None    FINDINGS: Seen only on lateral view mildly  superiorly displaced fracture  fragment just superior to the tarsometatarsal joints, favored to be from the  base of one of the metatarsals (second through fourth), or less likely from the  distal tarsal row. There is overlying soft tissue swelling. No other fracture,  no dislocation, or periosteal reaction. No degenerative change. No focal lytic  or sclerotic lesion. No other evident soft tissue abnormality.    IMPRESSION:  Fracture detailed above. Consider orthopedic referral. Could be further  evaluated by CT if needed.  ?  Signed By: Violet Baldy C-MD    .      Impression and Plan   Diagnosis   Sprain of left foot (ICD10-CM S93.602A, Discharge, Medical)   Plan   Condition: Improved.    Disposition: Medically cleared, Discharged: Time  01/12/2020 22:05:00, to home.    Prescriptions: [facility] prescriptions   Pharmacy:  Norco 325 mg-5 mg oral tablet (Prescribe): 1 tabs, Oral, q6hr, for 3 days, PRN: moderate pain (4-7), 12 tabs, 0 Refill(s)  .    Patient was given the following educational materials: Foot Sprain.    Follow up with: Follow up with primary care provider Within 1 week; Dola Factor, Physician - Orthopedics Within 1 week.    Counseled: I had a detailed discussion with the patient and/or guardian regarding the historical points/exam findings supporting the discharge diagnosis and need for outpatient followup. Discussed the need to return to the ER if symptoms persist/worsen, or for any questions/concerns that arise at home.    Signature Line     Electronically Signed on 01/12/2020 10:07 PM EDT   ________________________________________________   Milon Dikes S-MD      Electronically Signed on 01/12/2020 10:39 PM EDT   ________________________________________________   Milon Dikes S-MD            Modified by: Milon Dikes S-MD on 01/12/2020 10:07 PM EDT      Modified by: Milon Dikes S-MD on 01/12/2020 10:39 PM EDT

## 2020-01-12 NOTE — ED Notes (Signed)
ED Patient Education Note     Patient Education Materials Follows:  Musculoskeletal     Foot Sprain    A foot sprain is an injury to one of the strong bands of tissue (ligaments) that connect and support the many bones in your feet. The ligament can be stretched too much or it can tear. A tear can be either partial or complete. The severity of the sprain depends on how much of the ligament was damaged or torn.    CAUSES    A foot sprain is usually caused by suddenly twisting or pivoting your foot.    RISK FACTORS    This injury is more likely to occur in people who:     Play a sport, such as basketball or football.     Exercise or play a sport without warming up.     Start a new workout or sport.     Suddenly increase how long or hard they exercise or play a sport.    SYMPTOMS    Symptoms of this condition start soon after an injury and include:     Pain, especially in the arch of the foot.     Bruising.     Swelling.     Inability to walk or use the foot to support body weight.    DIAGNOSIS    This condition is diagnosed with a medical history and physical exam. You may also have imaging tests, such as:     X-rays to make sure there are no broken bones (fractures).     MRI to see if the ligament has torn.    TREATMENT    Treatment varies depending on the severity of your sprain. Mild sprains can be treated with rest, ice, compression, and elevation (RICE). If your ligament is overstretched or partially torn, treatment usually involves keeping your foot in a fixed position (immobilization) for a period of time. To help you do this, your health care provider will apply a bandage, splint, or walking boot to keep your foot from moving until it heals. You may also be advised to use crutches or a scooter for a few weeks to avoid bearing weight on your foot while it is healing.    If your ligament is fully torn, you may need surgery to reconnect the ligament to the bone. After surgery, a cast or splint will be applied and  will need to stay on your foot while it heals.    Your health care provider may also suggest exercises or physical therapy to strengthen your foot.    HOME CARE INSTRUCTIONS    If You Have a Bandage, Splint, or Walking Boot:     Wear it as directed by your health care provider. Remove it only as directed by your health care provider.     Loosen the bandage, splint, or walking boot if your toes become numb and tingle, or if they turn cold and blue.    Bathing     If your health care provider approves bathing and showering, cover the bandage or splint with a watertight plastic bag to protect it from water. Do not let the bandage or splint get wet.    Managing Pain, Stiffness, and Swelling      If directed, apply ice to the injured area:    ? Put ice in a plastic bag.    ? Place a towel between your skin and the bag.    ? Leave the ice on   for 20 minutes, 2?3 times per day.     Move your toes often to avoid stiffness and to lessen swelling.     Raise (elevate) the injured area above the level of your heart while you are sitting or lying down.    Driving     Do not drive or operate heavy machinery while taking pain medicine.     Ask your health care provider when it is safe to drive if you have a bandage, splint, or walking boot on your foot.    Activity     Rest as directed by your health care provider.     Do not use the injured foot to support your body weight until your health care provider says that you can. Use crutches or other supportive devices as directed by your health care provider.     Ask your health care provider what activities are safe for you. Gradually increase how much and how far you walk until your health care provider says it is safe to return to full activity.     Do any exercise or physical therapy as directed by your health care provider.    General Instructions     If a splint was applied, do not put pressure on any part of it until it is fully hardened. This may take several hours.     Take  medicines only as directed by your health care provider. These include over-the-counter medicines and prescription medicines.     Keep all follow-up visits as directed by your health care provider. This is important.     When you can walk without pain, wear supportive shoes that have stiff soles. Do not wear flip-flops, and do not walk barefoot.    SEEK MEDICAL CARE IF:     Your pain is not controlled with medicine.     Your bruising or swelling gets worse or does not get better with treatment.     Your splint or walking boot is damaged.    SEEK IMMEDIATE MEDICAL CARE IF:     You develop severe numbness or tingling in your foot.     Your foot turns blue, white, or gray, and it feels cold.    This information is not intended to replace advice given to you by your health care provider. Make sure you discuss any questions you have with your health care provider.    Document Released: 03/24/2002 Document Revised: 09/13/2015 Document Reviewed: 08/05/2014  [employer]vier Interactive Patient Education ?2016 [employer]

## 2020-01-12 NOTE — Discharge Summary (Signed)
ED Clinical Summary                     [facility] [place] [address]  [phone]          PERSON INFORMATION  Name: Michelle Barrera, Michelle Barrera Age:  39 Years DOB: April 27, 1981   Sex: Female Language: English PCP: PCP,  NONE   Marital Status: Married Phone: [phone] Med Service: MED-Medicine   MRN: [id number] Acct# [id number] Arrival: 01/12/2020 21:20:00   Visit Reason: Foot pain-swelling; LEFT FOOT PAIN Acuity: 3 LOS: 000 01:14   Address:    [address] [place] [address]   Diagnosis:    Sprain of left foot  Medications:          New Medications  Printed Prescriptions  HYDROcodone-acetaminophen (Norco 325 mg-5 mg oral tablet) 1 Tabs Oral (given by mouth) every 6 hours as needed moderate pain (4-7) for 3 Days. Refills: 0.  Last Dose:____________________      Medications Administered During Visit:                Medication Dose Route   HYDROcodone-acetaminophen 1 tabs Oral               Allergies      penicillin (Rash)      Major Tests and Procedures:  The following procedures and tests were performed during your ED visit.  COMMON PROCEDURES%>  COMMON PROCEDURES COMMENTS%>                PROVIDER INFORMATION               Provider Role Assigned Ilsa Iha S-MD ED Provider 01/12/2020 21:21:08    Nicki Reaper, RN, Oletta Darter ED Nurse 01/12/2020 21:21:10        Attending Physician:  Milon Dikes S-MD      Admit Doc  Milon Dikes S-MD     Consulting Doc       VITALS INFORMATION  Vital Sign Triage Latest   Temp Oral ORAL_1%> ORAL%>   Temp Temporal TEMPORAL_1%> TEMPORAL%>   Temp Intravascular INTRAVASCULAR_1%> INTRAVASCULAR%>   Temp Axillary AXILLARY_1%> AXILLARY%>   Temp Rectal RECTAL_1%> RECTAL%>   02 Sat 99 % 99 %   Respiratory Rate RATE_1%> RATE%>   Peripheral Pulse Rate PULSE RATE_1%> PULSE RATE%>   Apical Heart Rate HEART RATE_1%> HEART RATE%>   Blood Pressure BLOOD PRESSURE_1%>/ BLOOD PRESSURE_1%>77 mmHg BLOOD PRESSURE%> / BLOOD  PRESSURE%>77 mmHg                 Immunizations      No Immunizations Documented This Visit          DISCHARGE INFORMATION   Discharge Disposition: H Outpt-Sent Home   Discharge Location:  Home   Discharge Date and Time:  01/12/2020 22:34:09   ED Checkout Date and Time:  01/12/2020 22:34:09     DEPART REASON INCOMPLETE INFORMATION               Depart Action Incomplete Reason   Interactive View/I&O Recently assessed               Problems      No Problems Documented              Smoking Status      No Smoking Status Documented         PATIENT EDUCATION INFORMATION  Instructions:  Foot Sprain     Follow up:                   With: Address: When:   Bari Mantis, Physician - Orthopedics [address], [address] [place], [place] [address]  [phone] Business (1) Within 1 week       With: Address: When:   Follow up with primary care provider  Within 1 week              ED PROVIDER DOCUMENTATION     Patient:   Michelle Barrera             MRN: [id number]            FIN: [id number]               Age:   50 years     Sex:  Female     DOB:  1981-05-13   Associated Diagnoses:   Sprain of left foot   Author:   Gordy Councilman S-MD      Basic Information   Time seen: Provider Seen (ST)   ED Provider/Time:    Gordy Councilman S-MD / 01/12/2020 21:21  .   Additional information: Chief Complaint from Nursing Triage Note   Chief Complaint  Chief Complaint: Missed a step and foot went out under her hyperflexing her foot.  She felt a pop. (01/12/20 21:25:00).     39 year old female presents for concerns of left foot pain and tenderness along the anterior surface.  It occurred after hyperflexing her foot few hours prior to arrival.  Patient states that anytime she walks she would feel something pop.  No focal weakness, numbness, tingling.  Soft tissue swelling has increased.  She has been placing some ice with intermittent improvement.  No other symptoms endorsed.      Review of Systems   Constitutional symptoms:  Negative  except as documented in HPI.   Musculoskeletal symptoms:  Muscle pain, Joint pain.    Neurologic symptoms:  Negative except as documented in HPI.             Additional review of systems information: All other systems reviewed and otherwise negative.      Health Status   Allergies:    Allergic Reactions (All)  Moderate  Penicillin- Rash..      Past Medical/ Family/ Social History   Medical history: Reviewed as documented in chart.   Surgical history: Reviewed as documented in chart.   Family history: Not significant.   Social history: Reviewed as documented in chart.   Problem list:    No qualifying data available  .      Physical Examination               Vital Signs             Time:  01/12/2020 22:04:00.   Vital Signs   01/12/2020 21:25 EDT Systolic Blood Pressure 132 mmHg    Diastolic Blood Pressure 77 mmHg    Temperature Oral 37 degC    Heart Rate Monitored 87 bpm    Respiratory Rate 17 br/min    SpO2 99 %   .   Oxygen saturation.   General:  Alert, no acute distress.    Skin:  Warm, dry, pink.    Head:  Normocephalic.   Neck:  Supple, trachea midline.    Eye:  Pupils are equal, round and reactive to light, extraocular movements are intact.  Cardiovascular:  Regular rate and rhythm, Normal peripheral perfusion.    Respiratory:  Lungs are clear to auscultation, respirations are non-labored.    Musculoskeletal:  Left foot exam remarkable for soft tissue swelling anteriorly, diffuse and nonfocal tenderness to palpation.  Neurovascularly intact..   Chest wall   Neurological:  Alert and oriented to person, place, time, and situation, No focal neurological deficit observed.    Lymphatics   Psychiatric:  Cooperative, appropriate mood & affect.       Medical Decision Making   Rationale:  39 year old female presents for concerns of left foot pain and tenderness after a straining mechanism.  I see no acute fracture on my review.  Given soft tissue swelling, we will put her in a postop shoe and have her follow-up with  orthopedics..   Documents reviewed:  Emergency department nurses' notes, flowsheet, emergency department records, prior records, vital signs.       Impression and Plan   Diagnosis   Sprain of left foot (ICD10-CM S93.602A, Discharge, Medical)   Plan   Condition: Improved.    Disposition: Medically cleared, Discharged: Time  01/12/2020 22:05:00, to home.    Prescriptions: Launch prescriptions   Pharmacy:  Norco 325 mg-5 mg oral tablet (Prescribe): 1 tabs, Oral, q6hr, for 3 days, PRN: moderate pain (4-7), 12 tabs, 0 Refill(s).    Patient was given the following educational materials: Foot Sprain.    Follow up with: Follow up with primary care provider Within 1 week; Bari Mantis, Physician - Orthopedics Within 1 week.    Counseled: I had a detailed discussion with the patient and/or guardian regarding the historical points/exam findings supporting the discharge diagnosis and need for outpatient followup. Discussed the need to return to the ER if symptoms persist/worsen, or for any questions/concerns that arise at home.

## 2020-01-12 NOTE — ED Notes (Signed)
ED Triage Note       ED Secondary Triage Entered On:  01/12/2020 21:31 EDT    Performed On:  01/12/2020 21:30 EDT by Lorin Picket, RN, Rudean Haskell               General Information   Barriers to Learning :   None evident   COVID-19 Vaccine Status :   2 Doses received   2 Doses Received Manufacturer :   Moderna vaccine   ED Home Meds Section :   Document assessment   [facility] ED Fall Risk Section :   Document assessment   ED Advance Directives Section :   Document assessment   ED Palliative Screen :   N/A (prefilled for <65yo)   Lorin Picket, RN, Rudean Haskell - 01/12/2020 21:30 EDT   (As Of: 01/12/2020 21:31:24 EDT)   Diagnoses(Active)    Foot pain-swelling  Date:   01/12/2020 ; Diagnosis Type:   Reason For Visit ; Confirmation:   Complaint of ; Clinical Dx:   Foot pain-swelling ; Classification:   Medical ; Clinical Service:   Emergency medicine ; Code:   PNED ; Probability:   0 ; Diagnosis Code:   [id number]             -    Procedure History   (As Of: 01/12/2020 21:31:24 EDT)     [facility]hoebe Perch Fall Risk Assessment Tool   Hx of falling last 3 months ED Fall :   No   Patient confused or disoriented ED Fall :   No   Patient intoxicated or sedated ED Fall :   No   Patient impaired gait ED Fall :   No   Use a mobility assistance device ED Fall :   No   Patient altered elimination ED Fall :   No   [facility] ED Fall Score :   0    Juline Patch - 01/12/2020 21:30 EDT   ED Advance Directive   Advance Directive :   No   Lorin Picket RN, Rudean Haskell - 01/12/2020 21:30 EDT   Med Hx   Medication List   (As Of: 01/12/2020 21:31:24 EDT)

## 2020-01-12 NOTE — ED Notes (Signed)
 ED Patient Summary       ;       [facility] Emergency Department  [address] [place], [place] [address]  [phone]  Discharge Instructions (Patient)  _______________________________________     Michelle Barrera  DOB:  01/13/1981                   MRN: [id number]                   FIN: [id number]  Reason For Visit: Foot pain-swelling; LEFT FOOT PAIN  Final Diagnosis: Sprain of left foot     Visit Date: 01/12/2020 21:20:00  Address: [address] [name] [address]  Phone: [phone]     Emergency Department Providers:         Primary Physician:   BASILIO NELWYN RAMAN         [facility] would like to thank you for allowing us  to assist you with your healthcare needs. The following includes patient education materials and information regarding your injury/illness.     Follow-up Instructions:  You were seen today on an emergency basis. Please contact your primary care doctor for a follow up appointment. If you received a referral to a specialist doctor, it is important you follow-up as instructed.    It is important that you call your follow-up doctor to schedule and confirm the location of your next appointment. Your doctor may practice at multiple locations. The office location of your follow-up appointment may be different to the one written on your discharge instructions.    If you do not have a primary care doctor, please call [phone] for help in finding a [name] Provider. For help in finding a specialist doctor, please call [phone].    The [facility] "Ask a Nurse" line in staffed by Registered Nurses and is a free service to the community. We are available Monday - Friday from 8am to 5pm to answer your questions about your health. Please call [phone].    If your condition gets worse before your follow-up with your primary care doctor or specialist, please return to the Emergency  Department.        Coronavirus 2019 (COVID-19) Reminders:     Patients aged 55 and older, people with increased risk for severe COVID-19 disease, or frontline workers with increased occupational risk can make an appointment for a COVID-19 vaccine. Patients can contact their [facility] doctors' offices to schedule an appointment to receive the COVID-19 vaccine at the [facility] or send us  an email at [email or web address]. Patients who do not have a [facility] physician can call [phone] to schedule vaccination appointments.            Scan this code with your phone camera to send an email to the address above.          Follow Up Appointments:  Primary Care Provider:      Name: PCP,  NONE      Phone:                  With: Address: When:   [name], Physician - Orthopedics [address], [address] [place], [place] [address]  [phone] Business (1) Within 1 week       With: Address: When:   Follow up with primary care provider  Within 1 week  Printed Prescriptions:    Patient Education Materials:  Discharge Orders          Discharge Patient 01/12/20 22:07:00 EDT         Comment:      Foot Sprain     Foot Sprain    A foot sprain is an injury to one of the strong bands of tissue (ligaments) that connect and support the many bones in your feet. The ligament can be stretched too much or it can tear. A tear can be either partial or complete. The severity of the sprain depends on how much of the ligament was damaged or torn.    CAUSES    A foot sprain is usually caused by suddenly twisting or pivoting your foot.    RISK FACTORS    This injury is more likely to occur in people who:     Play a sport, such as basketball or football.     Exercise or play a sport without warming up.     Start a new workout or sport.     Suddenly increase how long or hard they exercise or play a sport.    SYMPTOMS    Symptoms of this condition start soon after an  injury and include:     Pain, especially in the arch of the foot.     Bruising.     Swelling.     Inability to walk or use the foot to support body weight.    DIAGNOSIS    This condition is diagnosed with a medical history and physical exam. You may also have imaging tests, such as:     X-rays to make sure there are no broken bones (fractures).     MRI to see if the ligament has torn.    TREATMENT    Treatment varies depending on the severity of your sprain. Mild sprains can be treated with rest, ice, compression, and elevation (RICE). If your ligament is overstretched or partially torn, treatment usually involves keeping your foot in a fixed position (immobilization) for a period of time. To help you do this, your health care provider will apply a bandage, splint, or walking boot to keep your foot from moving until it heals. You may also be advised to use crutches or a scooter for a few weeks to avoid bearing weight on your foot while it is healing.    If your ligament is fully torn, you may need surgery to reconnect the ligament to the bone. After surgery, a cast or splint will be applied and will need to stay on your foot while it heals.    Your health care provider may also suggest exercises or physical therapy to strengthen your foot.    HOME CARE INSTRUCTIONS    If You Have a Bandage, Splint, or Walking Boot:     Wear it as directed by your health care provider. Remove it only as directed by your health care provider.     Loosen the bandage, splint, or walking boot if your toes become numb and tingle, or if they turn cold and blue.    Bathing     If your health care provider approves bathing and showering, cover the bandage or splint with a watertight plastic bag to protect it from water. Do not let the bandage or splint get wet.    Managing Pain, Stiffness, and Swelling      If directed, apply ice to the injured area:    ?  Put ice in a plastic bag.    ? Place a towel between your skin and the bag.    ? Leave  the ice on for 20 minutes, 2?3 times per day.     Move your toes often to avoid stiffness and to lessen swelling.     Raise (elevate) the injured area above the level of your heart while you are sitting or lying down.    Driving     Do not drive or operate heavy machinery while taking pain medicine.     Ask your health care provider when it is safe to drive if you have a bandage, splint, or walking boot on your foot.    Activity     Rest as directed by your health care provider.     Do not use the injured foot to support your body weight until your health care provider says that you can. Use crutches or other supportive devices as directed by your health care provider.     Ask your health care provider what activities are safe for you. Gradually increase how much and how far you walk until your health care provider says it is safe to return to full activity.     Do any exercise or physical therapy as directed by your health care provider.    General Instructions     If a splint was applied, do not put pressure on any part of it until it is fully hardened. This may take several hours.     Take medicines only as directed by your health care provider. These include over-the-counter medicines and prescription medicines.     Keep all follow-up visits as directed by your health care provider. This is important.     When you can walk without pain, wear supportive shoes that have stiff soles. Do not wear flip-flops, and do not walk barefoot.    SEEK MEDICAL CARE IF:     Your pain is not controlled with medicine.     Your bruising or swelling gets worse or does not get better with treatment.     Your splint or walking boot is damaged.    SEEK IMMEDIATE MEDICAL CARE IF:     You develop severe numbness or tingling in your foot.     Your foot turns blue, white, or gray, and it feels cold.    This information is not intended to replace advice given to you by your health care provider. Make sure you discuss any questions you have  with your health care provider.    Document Released: 03/24/2002 Document Revised: 09/13/2015 Document Reviewed: 08/05/2014  Elsevier Interactive Patient Education ?2016 Elsevier Inc.         Allergy Info: penicillin     Medication Information:  [facility] ED Physicians provided you with a complete list of medications post discharge, if you have been instructed to stop taking a medication please ensure you also follow up with this information to your Primary Care Physician.  Unless otherwise noted, patient will continue to take medications as prescribed prior to the Emergency Room visit.  Any specific questions regarding your chronic medications and dosages should be discussed with your physician(s) and pharmacist.          HYDROcodone-acetaminophen (Norco 325 mg-5 mg oral tablet) 1 Tabs Oral (given by mouth) every 6 hours as needed moderate pain (4-7) for 3 Days. Refills: 0.      Medications Administered During Visit:  Medication Dose Route   HYDROcodone-acetaminophen 1 tabs Oral          Major Tests and Procedures:  The following procedures and tests were performed during your ED visit.  COMMON PROCEDURES%>  COMMON PROCEDURES COMMENTS%>          Laboratory Orders  No laboratory orders were placed.              Radiology Orders  Name Status Details   XR Foot Complete Left Completed 01/12/20 21:26:00 EDT, STAT 1 hour or less, Reason: Pain, Joint, Ankle/Foot, Transport Mode: STRETCHER, pp_set_radiology_subspecialty               Patient Care Orders  Name Status Details   Discharge Patient Ordered 01/12/20 22:07:00 EDT   ED Assessment Adult Completed 01/12/20 21:30:27 EDT, 01/12/20 21:30:27 EDT   ED Secondary Triage Completed 01/12/20 21:30:27 EDT, 01/12/20 21:30:27 EDT   ED Triage Adult Completed 01/12/20 21:20:14 EDT, 01/12/20 21:20:14 EDT   Splint Application Ordered 01/12/20 22:09:00 EDT, Once, 01/12/20 22:09:00 EDT, Post Op Shoe to Affected Foot        ---------------------------------------------------------------------------------------------------------------------  [facility]) encourages you to self-enroll in the [facility] Patient Portal.  [facility] Patient Portal will allow you to manage your personal health information securely from your own electronic device now and in the future.  To begin your Patient Portal enrollment process, please visit [email or web address] Click on "Sign up now" under [facility].  If you find that you need additional assistance on the [facility] Patient Portal or need a copy of your medical records, please call the [facility] Medical Records Office at [phone].  Comment:

## 2021-07-12 ENCOUNTER — Other Ambulatory Visit: Payer: Self-pay | Admitting: Obstetrics and Gynecology

## 2021-07-12 DIAGNOSIS — N6310 Unspecified lump in the right breast, unspecified quadrant: Secondary | ICD-10-CM

## 2021-07-12 DIAGNOSIS — N6320 Unspecified lump in the left breast, unspecified quadrant: Secondary | ICD-10-CM

## 2021-08-10 ENCOUNTER — Other Ambulatory Visit: Payer: BC Managed Care – PPO

## 2021-08-31 ENCOUNTER — Ambulatory Visit
Admission: RE | Admit: 2021-08-31 | Discharge: 2021-08-31 | Disposition: A | Discharge status: Home / Self Care | Payer: BC Managed Care – PPO | Source: Ambulatory Visit | Attending: Obstetrics and Gynecology | Admitting: Obstetrics and Gynecology

## 2021-08-31 ENCOUNTER — Other Ambulatory Visit: Payer: Self-pay

## 2021-08-31 ENCOUNTER — Ambulatory Visit: Payer: BC Managed Care – PPO

## 2021-08-31 DIAGNOSIS — N6320 Unspecified lump in the left breast, unspecified quadrant: Secondary | ICD-10-CM

## 2021-08-31 DIAGNOSIS — N6310 Unspecified lump in the right breast, unspecified quadrant: Secondary | ICD-10-CM

## 2021-09-02 ENCOUNTER — Other Ambulatory Visit (HOSPITAL_BASED_OUTPATIENT_CLINIC_OR_DEPARTMENT_OTHER): Payer: Self-pay

## 2021-09-02 ENCOUNTER — Ambulatory Visit: Payer: BC Managed Care – PPO | Attending: Internal Medicine

## 2021-09-02 DIAGNOSIS — Z23 Encounter for immunization: Secondary | ICD-10-CM

## 2021-09-02 MED ORDER — MODERNA COVID-19 BIVAL BOOSTER 50 MCG/0.5ML IM SUSP
INTRAMUSCULAR | 0 refills | Status: AC
  Filled 2021-09-02: qty 0.5, 1d supply, fill #0

## 2021-09-02 NOTE — Progress Notes (Signed)
   Covid-19 Vaccination Clinic  Name:  Erin Barajas    MRN: [id number] DOB: 01/31/1981  09/02/2021  [name] was observed post Covid-19 immunization for 15 minutes without incident. She was provided with Vaccine Information Sheet and instruction to access the V-Safe system.   [name] was instructed to call 911 with any severe reactions post vaccine: Difficulty breathing  Swelling of face and throat  A fast heartbeat  A bad rash all over body  Dizziness and weakness   Immunizations Administered     Name Date Dose VIS Date Route   Moderna Covid-19 vaccine Bivalent Booster 09/02/2021  1:50 PM 0.5 mL 05/28/2021 Intramuscular   Manufacturer: Gala Murdoch   Lot: [id number]   NDC: 54270-623-76
